# Patient Record
Sex: Male | Born: 1985 | Race: Black or African American | Hispanic: No | Marital: Single | Smoking: Never smoker
Health system: Southern US, Community
[De-identification: ages and names within clinical notes are randomized; demographics above are authoritative.]

---
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
## 2007-02-23 ENCOUNTER — Emergency Department: Payer: Self-pay | Admitting: Internal Medicine

## 2007-05-24 ENCOUNTER — Emergency Department: Payer: Self-pay | Admitting: Emergency Medicine

## 2007-09-05 ENCOUNTER — Emergency Department: Payer: Self-pay | Admitting: Emergency Medicine

## 2007-09-10 ENCOUNTER — Emergency Department: Payer: Self-pay | Admitting: Emergency Medicine

## 2009-05-18 ENCOUNTER — Emergency Department: Payer: Self-pay | Admitting: Emergency Medicine

## 2009-09-26 ENCOUNTER — Emergency Department: Payer: Self-pay | Admitting: Emergency Medicine

## 2011-06-16 ENCOUNTER — Emergency Department: Payer: Self-pay | Admitting: Emergency Medicine

## 2011-06-16 LAB — URINALYSIS, COMPLETE
Bacteria: NONE SEEN
Bilirubin,UR: NEGATIVE
Glucose,UR: NEGATIVE mg/dL (ref 0–75)
Leukocyte Esterase: NEGATIVE
Nitrite: NEGATIVE
Protein: NEGATIVE
RBC,UR: 1 /HPF (ref 0–5)
Specific Gravity: 1.014 (ref 1.003–1.030)
Squamous Epithelial: NONE SEEN
WBC UR: NONE SEEN /HPF (ref 0–5)

## 2011-07-07 ENCOUNTER — Emergency Department: Payer: Self-pay | Admitting: Emergency Medicine

## 2013-02-28 ENCOUNTER — Emergency Department: Payer: Self-pay | Admitting: Emergency Medicine

## 2013-02-28 LAB — COMPREHENSIVE METABOLIC PANEL
Albumin: 4.2 g/dL (ref 3.4–5.0)
Alkaline Phosphatase: 62 U/L (ref 50–136)
Anion Gap: 3 — ABNORMAL LOW (ref 7–16)
BUN: 11 mg/dL (ref 7–18)
Calcium, Total: 9.3 mg/dL (ref 8.5–10.1)
Chloride: 105 mmol/L (ref 98–107)
Co2: 31 mmol/L (ref 21–32)
Creatinine: 1.17 mg/dL (ref 0.60–1.30)
EGFR (African American): >60
EGFR (Non-African Amer.): >60
Osmolality: 277 (ref 275–301)
Potassium: 3.9 mmol/L (ref 3.5–5.1)
SGOT(AST): 28 U/L (ref 15–37)
SGPT (ALT): 26 U/L (ref 12–78)
Sodium: 139 mmol/L (ref 136–145)

## 2013-02-28 LAB — CBC
HGB: 14.9 g/dL (ref 13.0–18.0)
MCV: 86 fL (ref 80–100)
Platelet: 238 10*3/uL (ref 150–440)
RBC: 5.08 10*6/uL (ref 4.40–5.90)

## 2013-02-28 LAB — CLOSTRIDIUM DIFFICILE BY PCR

## 2013-02-28 LAB — WBCS, STOOL

## 2013-08-14 ENCOUNTER — Ambulatory Visit: Payer: Self-pay | Admitting: Gastroenterology

## 2013-10-05 IMAGING — CR LEFT THUMB 2+V
1 series · 3 of 3 positions shown · non-contrast
Comparison: none

REASON FOR EXAM: trauma
COMMENTS:

PROCEDURE:     DXR - DXR THUMB LEFT HAND (1ST DIGIT)  - July 07, 2011  [DATE]
RESULT:     Images of the left thumb demonstrate no definite fracture,
dislocation or foreign body.

[Series 1: x finger pa left · 0.14mm/px · 3 of 3 slices shown]
[im 1/3]
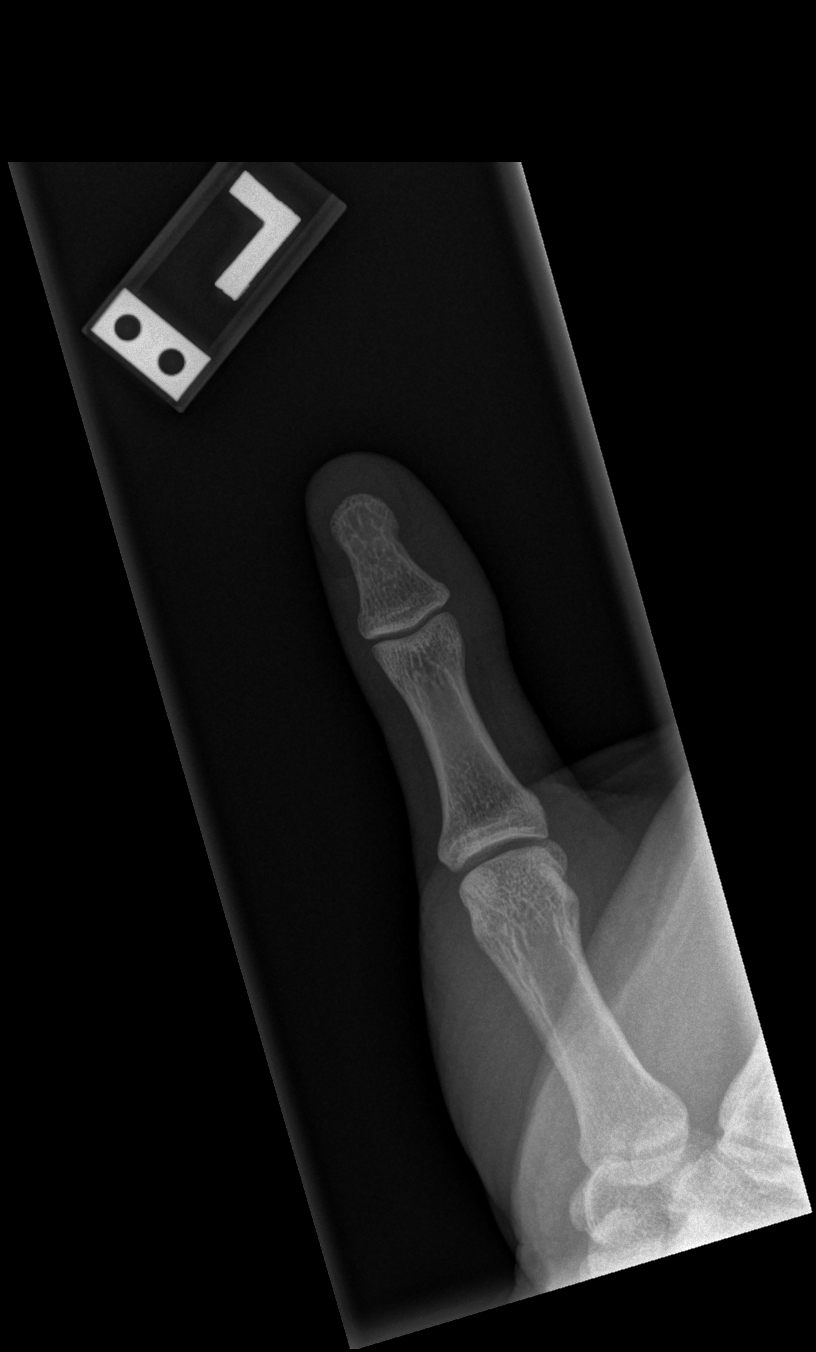
[im 2/3]
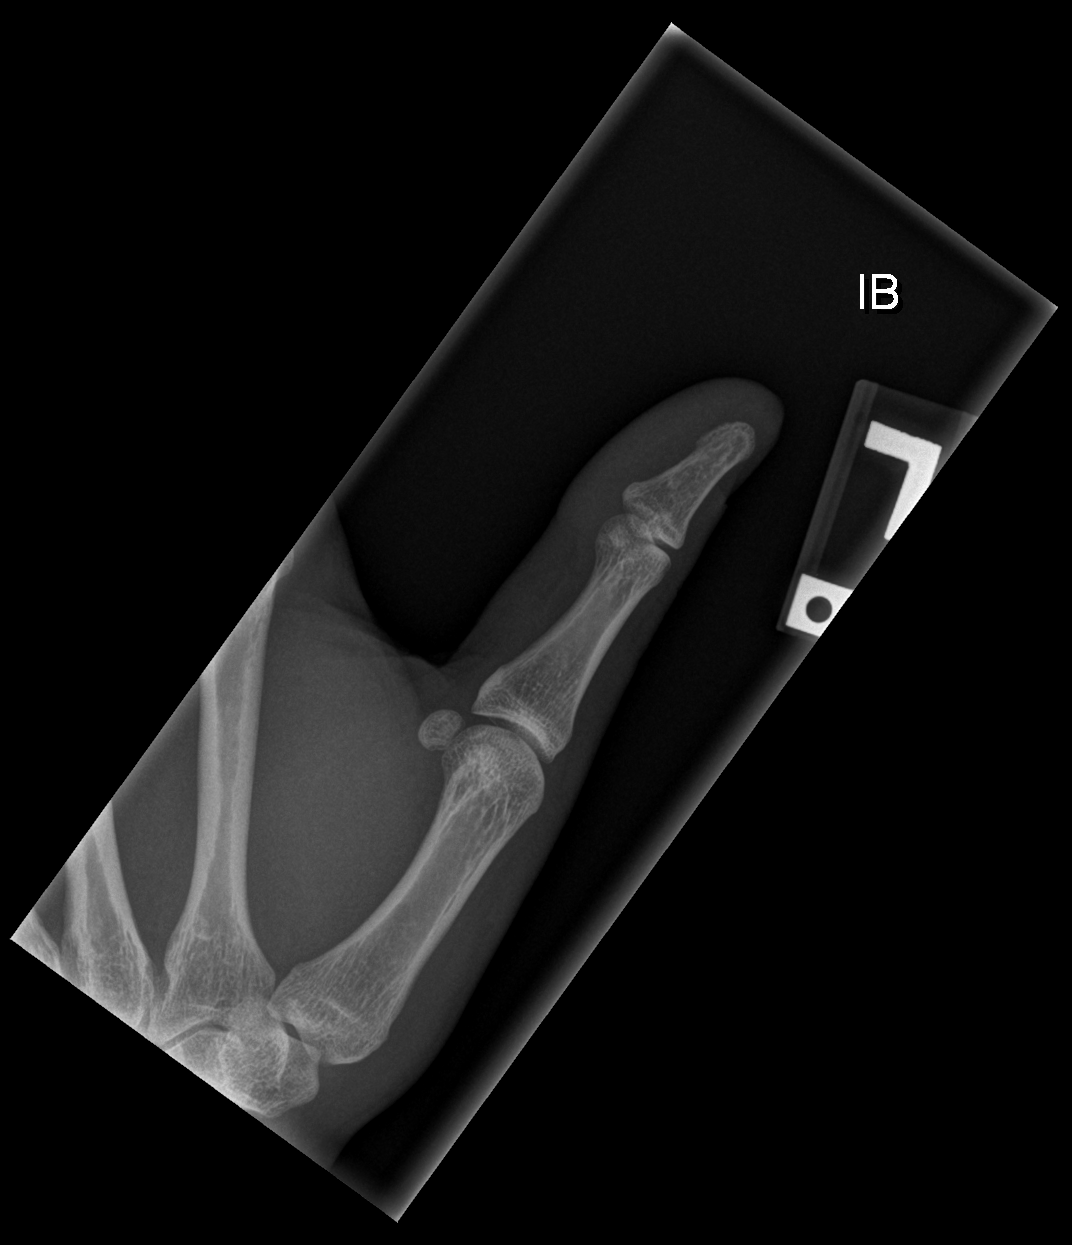
[im 3/3]
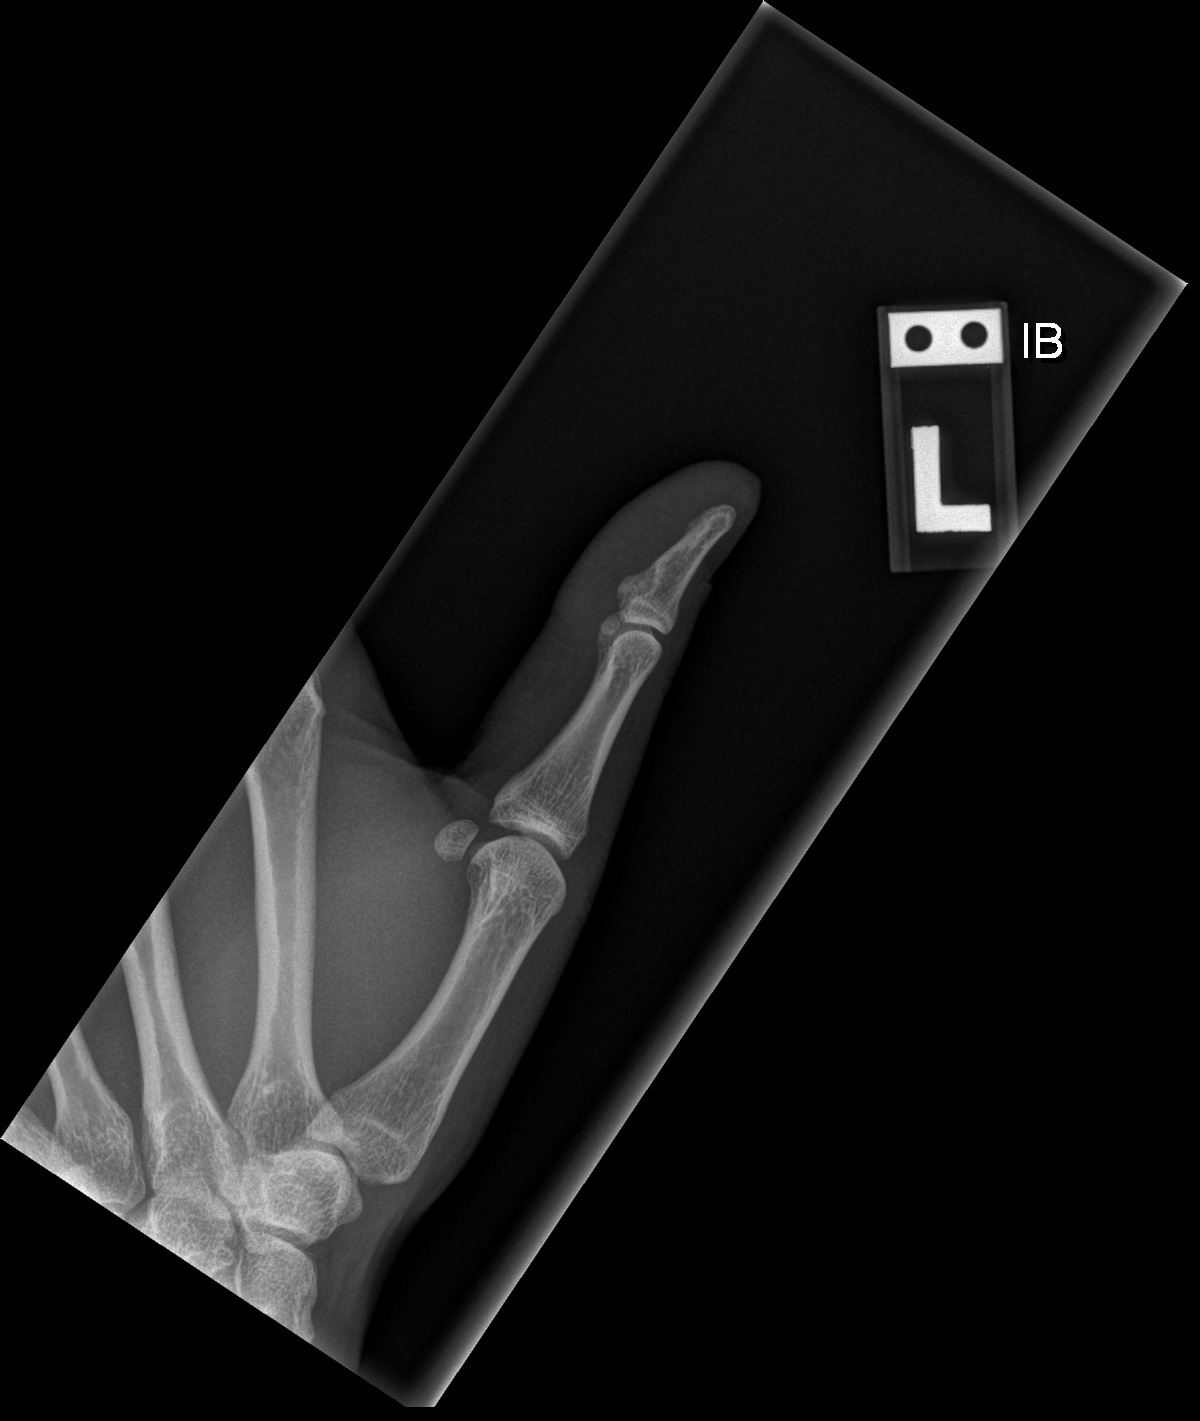

[3 of 3 positions shown; findings below may reference images not displayed]

IMPRESSION: 1. No acute abnormality evident.

## 2015-06-25 DIAGNOSIS — J069 Acute upper respiratory infection, unspecified: Secondary | ICD-10-CM | POA: Insufficient documentation

## 2015-06-25 DIAGNOSIS — R05 Cough: Secondary | ICD-10-CM | POA: Diagnosis present

## 2015-06-25 NOTE — ED Notes (Signed)
Pt reports 1 day of cough, congestion and chills. Pt reports subjective fever

## 2015-06-26 ENCOUNTER — Emergency Department
Admission: EM | Admit: 2015-06-26 | Discharge: 2015-06-26 | Disposition: A | Discharge status: Home / Self Care | Payer: No Typology Code available for payment source | Attending: Emergency Medicine | Admitting: Emergency Medicine

## 2015-06-26 ENCOUNTER — Encounter: Payer: Self-pay | Admitting: Emergency Medicine

## 2015-06-26 DIAGNOSIS — J069 Acute upper respiratory infection, unspecified: Secondary | ICD-10-CM

## 2015-06-26 DIAGNOSIS — M791 Myalgia, unspecified site: Secondary | ICD-10-CM

## 2015-06-26 LAB — RAPID INFLUENZA A&B ANTIGENS
Influenza A (ARMC): NOT DETECTED
Influenza B (ARMC): NOT DETECTED

## 2015-06-26 MED ORDER — IBUPROFEN 800 MG PO TABS: 800.0000 mg | ORAL_TABLET | Freq: Three times a day (TID) | ORAL | Status: AC | PRN

## 2015-06-26 MED ORDER — ACETAMINOPHEN 500 MG PO TABS
1000.0000 mg | ORAL_TABLET | Freq: Once | ORAL | Status: AC
  Administered 2015-06-26: 1000 mg via ORAL
  Filled 2015-06-26: qty 2

## 2015-06-26 NOTE — ED Provider Notes (Signed)
[facility] Emergency Department Provider Note  ____________________________________________  Time seen: Approximately 229 AM  I have reviewed the triage vital signs and the nursing notes.   HISTORY  Chief Complaint Cough and Nasal Congestion    HPI Shoshoni SHELLHAMMER is a 30 y.o. male who comes into the hospital today with flulike symptoms for the past 24 hours. The patient reports he came as a precaution because his girlfriend is pregnant. He's had some chills and sweats with some body aches and lower back pain. The patient is unsure if he's had a fever at home. He took Tylenol Cold and flu this morning and Advil a few hours ago. He's had some dry mouth. He complains of mild cough and congestion with no sore throat. He had some migraine with pressure behind his eyes yesterday but that is also gone at this time. The patient rates his pain in his back and his body as an 8 out of 10 in intensity.   History reviewed. No pertinent past medical history.  There are no active problems to display for this patient.   History reviewed. No pertinent past surgical history.  Current Outpatient Rx  Name  Route  Sig  Dispense  Refill  . ibuprofen (ADVIL,MOTRIN) 800 MG tablet   Oral   Take 1 tablet (800 mg total) by mouth every 8 (eight) hours as needed.   15 tablet   0     Allergies Review of patient's allergies indicates no known allergies.  No family history on file.  Social History Social History  Substance Use Topics  . Smoking status: Never Smoker   . Smokeless tobacco: None  . Alcohol Use: Yes    Review of Systems Constitutional: No fever/chills Eyes: No visual changes. ENT: Nasal congestion Cardiovascular: Denies chest pain. Respiratory: Cough Gastrointestinal: No abdominal pain.  No nausea, no vomiting.  No diarrhea.  No constipation. Genitourinary: Negative for dysuria. Musculoskeletal: Body aches Skin: Negative for rash. Neurological:  Negative for headaches, focal weakness or numbness.  10-point ROS otherwise negative.  ____________________________________________   PHYSICAL EXAM:  VITAL SIGNS: ED Triage Vitals  Enc Vitals Group     BP 06/25/15 2324 140/81 mmHg     Pulse Rate 06/25/15 2324 68     Resp 06/25/15 2324 18     Temp 06/25/15 2324 97.9 F (36.6 C)     Temp src --      SpO2 06/25/15 2324 97 %     Weight 06/25/15 2324 190 lb (86.183 kg)     Height 06/25/15 2324  (1.905 m)     Head Cir --      Peak Flow --      Pain Score 06/25/15 2324 0     Pain Loc --      Pain Edu? --      Excl. in GC? --     Constitutional: Alert and oriented. Well appearing and in mild distress. Eyes: Conjunctivae are normal. PERRL. EOMI. Head: Atraumatic. Nose: No congestion/rhinnorhea. Mouth/Throat: Mucous membranes are moist.  Oropharynx non-erythematous. Cardiovascular: Normal rate, regular rhythm. Grossly normal heart sounds.  Good peripheral circulation. Respiratory: Normal respiratory effort.  No retractions. Lungs CTAB. Gastrointestinal: Soft and nontender. No distention. Positive bowel sounds Musculoskeletal: No lower extremity tenderness nor edema.   Neurologic:  Normal speech and language.  Skin:  Skin is warm, dry and intact.  Psychiatric: Mood and affect are normal.   ____________________________________________   LABS (all labs ordered are listed, but  only abnormal results are displayed)  Labs Reviewed  RAPID INFLUENZA A&B ANTIGENS ([facility] ONLY)   ____________________________________________  EKG  None ____________________________________________  RADIOLOGY  None ____________________________________________   PROCEDURES  Procedure(s) performed: None  Critical Care performed: No  ____________________________________________   INITIAL IMPRESSION / ASSESSMENT AND PLAN / ED COURSE  Pertinent labs & imaging results that were available during my care of the patient were reviewed by me  and considered in my medical decision making (see chart for details).  This is a 30 year old male who comes into the hospital today with flulike symptoms. The patient reports he wants to get checked for flu because he has a pregnant girlfriend he wants to make sure she will be okay. We did a flu swab and the flu swab is negative. I gave the patient a gram of Tylenol for his pains and I'll give him Cipro for home. The patient be discharged home to follow-up with the acute care clinic or his primary care physician. ____________________________________________   FINAL CLINICAL IMPRESSION(S) / ED DIAGNOSES  Final diagnoses:  Upper respiratory infection  Muscle pain      Rebecka Apley, MD 06/26/15 [phone]

## 2015-06-26 NOTE — ED Notes (Addendum)
Pt states lower back pain Monday, chills and sweats yesterday during night. States migraine yesterday. Pt is talking slow, taking deep breaths. Pt stating he wants to get checked for flu. Pt states nasal congestion. Denies coughing. Pt states "I have constant dry mouth". Pt  States he took Tylenol and 2 Advil.

## 2015-06-26 NOTE — ED Notes (Signed)

## 2015-06-26 NOTE — Discharge Instructions (Signed)
Muscle Pain, Adult °Muscle pain (myalgia) may be caused by many things, including: °· Overuse or muscle strain, especially if you are not in shape. This is the most common cause of muscle pain. °· Injury. °· Bruises. °· Viruses, such as the flu. °· Infectious diseases. °· Fibromyalgia, which is a chronic condition that causes muscle tenderness, fatigue, and headache. °· Autoimmune diseases, including lupus. °· Certain drugs, including ACE inhibitors and statins. °Muscle pain may be mild or severe. In most cases, the pain lasts only a short time and goes away without treatment. To diagnose the cause of your muscle pain, your health care provider will take your medical history. This means he or she will ask you when your muscle pain began and what has been happening. If you have not had muscle pain for very long, your health care provider may want to wait before doing much testing. If your muscle pain has lasted a long time, your health care provider may want to run tests right away. If your health care provider thinks your muscle pain may be caused by illness, you may need to have additional tests to rule out certain conditions.  °Treatment for muscle pain depends on the cause. Home care is often enough to relieve muscle pain. Your health care provider may also prescribe anti-inflammatory medicine. °HOME CARE INSTRUCTIONS °Watch your condition for any changes. The following actions may help to lessen any discomfort you are feeling: °· Only take over-the-counter or prescription medicines as directed by your health care provider. °· Apply ice to the sore muscle: °· Put ice in a plastic bag. °· Place a towel between your skin and the bag. °· Leave the ice on for 15-20 minutes, 3-4 times a day. °· You may alternate applying hot and cold packs to the muscle as directed by your health care provider. °· If overuse is causing your muscle pain, slow down your activities until the pain goes away. °· Remember that it is normal  to feel some muscle pain after starting a workout program. Muscles that have not been used often will be sore at first. °· Do regular, gentle exercises if you are not usually active. °· Warm up before exercising to lower your risk of muscle pain. °· Do not continue working out if the pain is very bad. Bad pain could mean you have injured a muscle. °SEEK MEDICAL CARE IF: °· Your muscle pain gets worse, and medicines do not help. °· You have muscle pain that lasts longer than 3 days. °· You have a rash or fever along with muscle pain. °· You have muscle pain after a tick bite. °· You have muscle pain while working out, even though you are in good physical condition. °· You have redness, soreness, or swelling along with muscle pain. °· You have muscle pain after starting a new medicine or changing the dose of a medicine. °SEEK IMMEDIATE MEDICAL CARE IF: °· You have trouble breathing. °· You have trouble swallowing. °· You have muscle pain along with a stiff neck, fever, and vomiting. °· You have severe muscle weakness or cannot move part of your body. °MAKE SURE YOU:  °· Understand these instructions. °· Will watch your condition. °· Will get help right away if you are not doing well or get worse. °  °This information is not intended to replace advice given to you by your health care provider. Make sure you discuss any questions you have with your health care provider. °  °Document Released:   03/12/2006 Document Revised: 05/11/2014 Document Reviewed: 02/14/2013 Elsevier Interactive Patient Education 2016 [employer]  Viral Infections A viral infection can be caused by different types of viruses.Most viral infections are not serious and resolve on their own. However, some infections may cause severe symptoms and may lead to further complications. SYMPTOMS Viruses can frequently cause:  Minor sore throat.  Aches and pains.  Headaches.  Runny nose.  Different types of rashes.  Watery  eyes.  Tiredness.  Cough.  Loss of appetite.  Gastrointestinal infections, resulting in nausea, vomiting, and diarrhea. These symptoms do not respond to antibiotics because the infection is not caused by bacteria. However, you might catch a bacterial infection following the viral infection. This is sometimes called a "superinfection." Symptoms of such a bacterial infection may include:  Worsening sore throat with pus and difficulty swallowing.  Swollen neck glands.  Chills and a high or persistent fever.  Severe headache.  Tenderness over the sinuses.  Persistent overall ill feeling (malaise), muscle aches, and tiredness (fatigue).  Persistent cough.  Yellow, green, or brown mucus production with coughing. HOME CARE INSTRUCTIONS   Only take over-the-counter or prescription medicines for pain, discomfort, diarrhea, or fever as directed by your caregiver.  Drink enough water and fluids to keep your urine clear or pale yellow. Sports drinks can provide valuable electrolytes, sugars, and hydration.  Get plenty of rest and maintain proper nutrition. Soups and broths with crackers or rice are fine. SEEK IMMEDIATE MEDICAL CARE IF:   You have severe headaches, shortness of breath, chest pain, neck pain, or an unusual rash.  You have uncontrolled vomiting, diarrhea, or you are unable to keep down fluids.  You or your child has an oral temperature above 102 F (38.9 C), not controlled by medicine.  Your baby is older than 3 months with a rectal temperature of 102 F (38.9 C) or higher.  Your baby is 9 months old or younger with a rectal temperature of 100.4 F (38 C) or higher. MAKE SURE YOU:   Understand these instructions.  Will watch your condition.  Will get help right away if you are not doing well or get worse.   This information is not intended to replace advice given to you by your health care provider. Make sure you discuss any questions you have with your health  care provider.   Document Released: 01/28/2005 Document Revised: 07/13/2011 Document Reviewed: 09/26/2014 Else[employer] Interactive Patient Education [employer]

## 2019-09-18 ENCOUNTER — Other Ambulatory Visit: Payer: Self-pay | Admitting: Oncology
